# Patient Record
Sex: Male | Born: 1980
Health system: Southern US, Community
[De-identification: ages and names within clinical notes are randomized; demographics above are authoritative.]

## PROBLEM LIST (undated history)

## (undated) DIAGNOSIS — E785 Hyperlipidemia, unspecified: Secondary | ICD-10-CM

## (undated) HISTORY — PX: MOUTH SURGERY: SHX715

---

## 2001-03-26 ENCOUNTER — Emergency Department (HOSPITAL_COMMUNITY): Admission: EM | Admit: 2001-03-26 | Discharge: 2001-03-26 | Payer: Self-pay | Admitting: *Deleted

## 2002-10-05 ENCOUNTER — Emergency Department (HOSPITAL_COMMUNITY): Admission: EM | Admit: 2002-10-05 | Discharge: 2002-10-06 | Payer: Self-pay | Admitting: Emergency Medicine

## 2005-08-16 ENCOUNTER — Ambulatory Visit: Payer: Self-pay | Admitting: Internal Medicine

## 2005-08-25 ENCOUNTER — Ambulatory Visit (HOSPITAL_COMMUNITY): Admission: RE | Admit: 2005-08-25 | Discharge: 2005-08-25 | Payer: Self-pay | Admitting: Internal Medicine

## 2005-08-25 ENCOUNTER — Ambulatory Visit: Payer: Self-pay | Admitting: Internal Medicine

## 2009-09-17 ENCOUNTER — Emergency Department (HOSPITAL_COMMUNITY): Admission: EM | Admit: 2009-09-17 | Discharge: 2009-09-17 | Payer: Self-pay | Admitting: Emergency Medicine

## 2010-07-31 NOTE — Consult Note (Signed)
NAMETERIN, CRAGLE            ACCOUNT NO.:  [id number]   MEDICAL RECORD NO.:  [id number]           PATIENT TYPE:   LOCATION:                                 FACILITY:   PHYSICIAN:  R. Roetta Sessions, M.D. DATE OF BIRTH:  11-08-1980   DATE OF CONSULTATION:  08/16/2005  DATE OF DISCHARGE:                                   CONSULTATION   GASTROINTESTINAL CONSULT   REQUESTING PHYSICIAN:  Dr. Dimas Aguas.   REASON FOR CONSULTATION:  Chronic daily diarrhea.   HISTORY OF PRESENT ILLNESS:  Mr. Kemppainen is a 30 year old Caucasian male  who was referred through the courtesy of Dr. Dimas Aguas and Margaretha Seeds, NP.  He reports a 1-1/2 to 2-year history of chronic daily diarrhea.  He states  he works third shift and generally he will have around 7 loose bowel  movements per night.  He does note some mucous and his stools and denies any  rectal bleeding or melena.  He does have some generalized abdominal pain,  mostly to the right lower quadrant.  He describes the pain as sharp and  shooting.  It is intermittent and it is usually resolved post defecation.  He denies any nausea or vomiting.  He denies any history of constipation.  He denies any fever or chills with this.  He denies any recent foreign  travel.  He denies any antibiotic use.  No new medications except for  diclofenac, which he has taken for heel spurs over the last 6 months twice a  day.  He has started dicyclomine 10 mg t.i.d. about 3 months ago and this  does seem to help somewhat with the diarrhea although he continues to have  frequent stools.  He does have a history of heartburn and indigestion in the  past.  More recently, he has had a few symptoms.  He denies any anorexia,  dysphagia or odynophagia, or early satiety.   PAST MEDICAL HISTORY:  1.  Paranoid schizophrenia.  2.  Heel spurs.  3.  Wisdom teeth extraction.   CURRENT MEDICATIONS:  1.  Geodon 80 mg daily.  2.  Diclofenac 75 mg b.i.d..  3.  Dicyclomine 10 mg  t.i.d.   ALLERGIES:  NO KNOWN DRUG ALLERGIES.   FAMILY HISTORY:  There is no known family history of colorectal carcinoma or  lower GI problems.  Mother age 76 is healthy, he has never met his father,  he has 1 brother diagnosed with leukemia at age 73 and deceased at age 30.   SOCIAL HISTORY:  Mr. Leahy is single.  He lives with his mother and  stepfather.  He works third shift at [employer].  He has a 10-pack year  history of tobacco use.  He consumes a couple of shots of the liquor with 2-  3 beers per night about 4-5 nights a week.  He denies any drug use.   REVIEW OF SYSTEMS:  CONSTITUTION:  Weight is steadily increasing.  He denies  any fatigue.  See HPI.  CARDIOVASCULAR:  Denies any chest pain or  palpitations.  PULMONARY:  Denies any shortness of breath, dyspnea,  cough or  hemoptysis.  GI:  See HPI.   PHYSICAL EXAMINATION:  VITAL SIGNS:  Weight is 240 pounds, height is 72  inches, temp 98.6 degrees, blood pressure 116/70 and pulse 100.  GENERAL:  Mr. Eastridge is a 30 year old obese Caucasian male who is alert  and oriented, pleasant and cooperative in no acute distress.  HEENT:  Sclerae are clear and nonicteric.  Conjunctivae are pink.  Oropharynx is pink and moist without any lesions.  NECK:  Supple without mass or thyromegaly.  CHEST:  Heart regular rate and rhythm with normal S1 and S2 without any  murmurs, clicks, rubs or gallops.  He has right areola piercing.  LUNGS:  Clear to auscultation bilaterally.  ABDOMEN:  Positive bowel sounds x4.  No bruits auscultated.  Soft, nontender  and nondistended without any palpable mass or hepatosplenomegaly.  No  rebound tenderness or guarding.  RECTAL:  No external lesions visualized.  Good sphincter tone.  No  __________ mass is palpated.  A small amount of light brown stool was  obtained from the vault, which is hemoccult negative.  EXTREMITIES:  Without clubbing or edema bilaterally.  SKIN:  Pink, warm and dry.  He does have  an erythematous plaque to his left  inner thigh.   IMPRESSION:  Mr. Rickett is a 30 year old Caucasian male with a 1-1/2 to 2-  year history of chronic daily diarrhea.  He has between 6-7 bowel movements  per night and he works third shift.  He describes his bowel movements as  loose and sometimes watery with mucous.  Denies any blood in his stools.  Given these findings, I suspect he could have inflammatory bowel disease or  he may have severe IBS, as his symptoms are on a daily basis.  He is also on  an NSAID, which can at times produced NSAIDs-induced injury and diarrhea.  He is going to need a further evaluation given the significance of his  symptoms.   PLAN:  1.  CBC, sed rate, MET-7 and a TSH.  2.  Bentyl 10 mg p.o. a.c. and h.s. p.r.n. diarrhea, #60 with 1 refill.  3.  Will schedule colonoscopy with Dr. Jena Gauss in the near future.  I have      discussed the procedure including the risks and benefits to include, but      are not limited to, bleeding, infection, perforation or drug reaction.      He agrees with the plan and consent will be obtained.   We would like to thank Margaretha Seeds, the nurse practitioner, and Dr. Dimas Aguas  for allowing [place] to participate in the care of Mr. Beckford.     Nicholas Lose, N.P.      Jonathon Bellows, M.D.  Electronically Signed   KC/MEDQ  D:  08/16/2005  T:  08/17/2005  Job:  [id number]

## 2010-07-31 NOTE — Op Note (Signed)
NAMECATHERINE, OAK            ACCOUNT NO.:  [id number]   MEDICAL RECORD NO.:  [id number]          PATIENT TYPE:  AMB   LOCATION:  DAY                           FACILITY:  [facility]   PHYSICIAN:  R. Roetta Sessions, M.D. DATE OF BIRTH:  1980-11-28   DATE OF PROCEDURE:  08/25/2005  DATE OF DISCHARGE:                                 OPERATIVE REPORT   PROCEDURE PERFORMED:  Colonoscopy.   INDICATIONS FOR PROCEDURE:  The patient is a  30 year old gentleman with  chronic diarrhea.  He drinks alcohol fairly heavily on a regular basis and  takes diclofenac on regular basis for osteoarthritis symptoms.  Colonoscopy  is now being done.  This approach has been discussed with the patient at  length.  Potential risks, benefits and alternatives have been reviewed,  questions have been answered, he is agreeable.  Please see documentation in  the medical records.   PROCEDURE NOTE:  Oxygen saturations, blood pressure, pulse and respirations  were monitored throughout the entire procedure.   CONSCIOUS SEDATION:  Versed 5 mg IV, Demerol 75 mg IV in divided doses.   FINDINGS:  Digital exam revealed no abnormalities.   ENDOSCOPIC FINDINGS:  Prep was good.   Rectum:  Examination of the rectal mucosa including retroflex view of the  anal verge revealed no abnormalities.  Colon:  Colonic mucosa was surveyed from the rectosigmoid junction to the  left, transverse and right colon to the area of the appendiceal orifice and  ileocecal valve and cecum.  These structures were well seen and photographed  for the record.  From this level, the scope was slowly withdrawn.  All  previously mentioned mucosal surfaces were again seen.  The terminal ileum  was intubated to a good 10 cm.  The colonic mucosa appeared entirely normal  as did the terminal ileal mucosa.  Stool samples were collected for  microbiology studies.  The patient tolerated the procedure well, was reacted  in endoscopy.   IMPRESSION:  Normal  rectum, colon and terminal ileum.   Incidentally, Mr. Robb tells me that starting on Bentyl has  significantly slowed his diarrhea.  I suspect that we are dealing with  irritable bowel syndrome; however, his alcohol consumption on a regular  weekly basis by my estimation is excessive and this may be contributing to  diarrhea.  In addition, diclofenac in combination may also be producing an  element of microscopic colitis or small bowel injury contributing to his  loose bowels.   RECOMMENDATIONS:  1.  I have asked Mr. Starace to cut back significantly on alcohol at least      by 50%.  2.  Stop diclofenac for the time being.  3.  Continue Bentyl 10 mg a.c. and h.s. as needed.  IBS literature given to      Mr. Arnone.  4.  Follow-up on stool studies.  5.  Follow-up appointment with [place] in three months.   ADDENDUM:  From August 17, 2005 a CBC and MET7 completely normal.  A TSH normal  at 1.436.  Sed rate 3.      R. Roetta Sessions, M.D.  Electronically Signed     RMR/MEDQ  D:  08/25/2005  T:  08/25/2005  Job:  [id number]   cc:   Marylee Floras, FNP health dept   Selinda Flavin  Fax: [phone]

## 2014-04-14 ENCOUNTER — Ambulatory Visit (INDEPENDENT_AMBULATORY_CARE_PROVIDER_SITE_OTHER): Payer: BLUE CROSS/BLUE SHIELD | Admitting: Family Medicine

## 2014-04-14 VITALS — BP 134/90 | HR 83 | Temp 98.2°F | Resp 16 | Ht 74.0 in | Wt 244.0 lb

## 2014-04-14 DIAGNOSIS — J019 Acute sinusitis, unspecified: Secondary | ICD-10-CM

## 2014-04-14 DIAGNOSIS — Z720 Tobacco use: Secondary | ICD-10-CM

## 2014-04-14 DIAGNOSIS — K047 Periapical abscess without sinus: Secondary | ICD-10-CM

## 2014-04-14 DIAGNOSIS — H6011 Cellulitis of right external ear: Secondary | ICD-10-CM

## 2014-04-14 DIAGNOSIS — F17200 Nicotine dependence, unspecified, uncomplicated: Secondary | ICD-10-CM

## 2014-04-14 MED ORDER — MUPIROCIN 2 % EX OINT: 1.0000 "application " | TOPICAL_OINTMENT | Freq: Three times a day (TID) | CUTANEOUS | Status: AC

## 2014-04-14 MED ORDER — MAGIC MOUTHWASH W/LIDOCAINE: 10.0000 mL | ORAL | Status: DC | PRN

## 2014-04-14 MED ORDER — IPRATROPIUM-ALBUTEROL 20-100 MCG/ACT IN AERS: 1.0000 | INHALATION_SPRAY | Freq: Four times a day (QID) | RESPIRATORY_TRACT | Status: AC

## 2014-04-14 MED ORDER — ALBUTEROL SULFATE (2.5 MG/3ML) 0.083% IN NEBU
2.5000 mg | INHALATION_SOLUTION | Freq: Once | RESPIRATORY_TRACT | Status: AC
  Administered 2014-04-14: 2.5 mg via RESPIRATORY_TRACT

## 2014-04-14 MED ORDER — AMOXICILLIN-POT CLAVULANATE 875-125 MG PO TABS: 1.0000 | ORAL_TABLET | Freq: Two times a day (BID) | ORAL | Status: DC

## 2014-04-14 NOTE — Progress Notes (Signed)
Subjective:  This chart was scribed for Danny SorensonEva Elen Acero, MD by Modena JanskyAlbert Thayil, ED Scribe. This patient was seen in room 8 and the patient's care was started at 2:00 PM.   Patient ID: Danny Mathews, male    DOB: 30-Oct-1980, 34 y.o.   MRN: [id number] Chief Complaint  Patient presents with  . Mass    roof of mouth  . Cough  . Sore Throat    HPI HPI Comments: Danny Mathews is a 34 y.o. male who presents to the [facility] complaining of an oral sore and cough.   He reports that he has a sore on the roof of his mouth. He states that he noticed a bump about a week after having dental work done. He reports no antibiotic use prior to the dental work. He states that he had an episode of subjective fever and chills 2 days ago. He denies any known discharge or swelling from the sore.   He states that he has some cough and sinus pain. He reports that his cough is productive of yellow phlegm, and the same is coming from his nose. He states he also had some mild SOB and nausea. He reports that he used theraflu and muccinex with relief.     He reports that he smokes about .5 to 1 PPD. He denies any diarrhea, constipation, appetite change, vomiting, or sleep disturbance.   History reviewed. No pertinent past medical history. Allergies no known allergies No current outpatient prescriptions on file prior to visit.   No current facility-administered medications on file prior to visit.    Review of Systems  Constitutional: Positive for fever and chills. Negative for appetite change.  HENT: Positive for sore throat.   Respiratory: Positive for cough and shortness of breath.   Gastrointestinal: Positive for nausea. Negative for diarrhea and constipation.  Psychiatric/Behavioral: Negative for sleep disturbance.      Objective:   Physical Exam  Constitutional: He is oriented to person, place, and time. He appears well-developed and well-nourished. No distress.  HENT:  Head:  Normocephalic and atraumatic.  2 cm nodule with fluctuance, TTP, and no erythema on the right posterior roof of mouth at the posterior part at the hard palate that comes  to a head with a pustule.   Erythema and purulent discharge around piercing. TMs clear.   Nasal mucousal edema. Erythematous orophranyx.   Neck: Neck supple.  Some Submandibular cervical adenopathy.   Cardiovascular: Normal rate, regular rhythm and normal heart sounds.  Exam reveals no gallop and no friction rub.   No murmur heard. Pulmonary/Chest: Effort normal and breath sounds normal. No respiratory distress. He has no wheezes. He has no rales.  Musculoskeletal: Normal range of motion.  Lymphadenopathy:    He has cervical adenopathy.  Neurological: He is alert and oriented to person, place, and time.  Skin: Skin is warm and dry.  Psychiatric: He has a normal mood and affect. His behavior is normal.  Nursing note and vitals reviewed.  BP 134/90 mmHg  Pulse 83  Temp(Src) 98.2 F (36.8 C) (Oral)  Resp 16  Ht 6\' 2"  (1.88 m)  Wt 244 lb (110.678 kg)  BMI 31.31 kg/m2  SpO2 98%    Assessment & Plan:   Dental abscess - Plan: Wound culture - drained in clinic by PA Clark - pt tolerated well w/ immediate reduction in pain - start augmentin and f/u w/ dentist in 1-2 wks.  Acute sinusitis, recurrence not specified,  unspecified location - Plan: albuterol (PROVENTIL) (2.5 MG/3ML) 0.083% nebulizer solution 2.5 mg  Tobacco use disorder - pt w/ symptomatic improvement after duoneb so try combivent prn while ill.  Cellulitis of earlobe, right>left around ear gage piercing - all foreign bodies currently removed - Augmentin should provide adequate coverage but can also add on top bactroban after wet warm compresses - ok to cont cleaning w/ peroxide and soap/water, RTC if worsening.  Meds ordered this encounter  Medications  . amoxicillin-clavulanate (AUGMENTIN) 875-125 MG per tablet    Sig: Take 1 tablet by mouth 2 (two)  times daily.    Dispense:  28 tablet    Refill:  0  . Alum & Mag Hydroxide-Simeth (MAGIC MOUTHWASH W/LIDOCAINE) SOLN    Sig: Take 10 mLs by mouth every 2 (two) hours as needed for mouth pain.    Dispense:  360 mL    Refill:  0    Mix 1:1 ratio of maalox and viscous lidocaine.  . mupirocin ointment (BACTROBAN) 2 %    Sig: Apply 1 application topically 3 (three) times daily.    Dispense:  30 g    Refill:  0  . albuterol (PROVENTIL) (2.5 MG/3ML) 0.083% nebulizer solution 2.5 mg    Sig:   . Ipratropium-Albuterol (COMBIVENT) 20-100 MCG/ACT AERS respimat    Sig: Inhale 1 puff into the lungs every 6 (six) hours.    Dispense:  4 g    Refill:  0    I personally performed the services described in this documentation, which was scribed in my presence. The recorded information has been reviewed and considered, and addended by me as needed.  Danny Sorenson, MD MPH

## 2014-04-14 NOTE — Patient Instructions (Signed)

## 2014-04-14 NOTE — Progress Notes (Signed)
Procedure:  Verbal consent obtained.  Patient declined local anesthesia.  An 18 gauge needle was used to incise the abscess.  Purulent material was expressed and suctioned.  The patient tolerated the procedure without complication.  Deliah BostonMichael Coralee Edberg, MS, PA-C   2:42 PM, 04/14/2014

## 2014-04-17 LAB — WOUND CULTURE
Gram Stain: NONE SEEN
Gram Stain: NONE SEEN
Gram Stain: NONE SEEN
Organism ID, Bacteria: NO GROWTH

## 2014-04-19 ENCOUNTER — Telehealth: Payer: Self-pay | Admitting: Family Medicine

## 2014-04-19 NOTE — Telephone Encounter (Signed)
Patient called back about lab results. Delivered the following message: "No bacteria grew from the culture that was collected from the abscess on the roof of his mouth when it was drained - however, still needs to complete full antibiotic course and f/u w/ dentist asap." Patient understood. Please call [phone] if further discussion is necessary.  Thanks, Costco WholesaleJasmine

## 2015-10-10 ENCOUNTER — Emergency Department (HOSPITAL_COMMUNITY): Payer: BLUE CROSS/BLUE SHIELD

## 2015-10-10 ENCOUNTER — Encounter (HOSPITAL_COMMUNITY): Payer: Self-pay

## 2015-10-10 ENCOUNTER — Ambulatory Visit (INDEPENDENT_AMBULATORY_CARE_PROVIDER_SITE_OTHER): Payer: BLUE CROSS/BLUE SHIELD | Admitting: Physician Assistant

## 2015-10-10 ENCOUNTER — Emergency Department (HOSPITAL_COMMUNITY)
Admission: EM | Admit: 2015-10-10 | Discharge: 2015-10-10 | Disposition: A | Discharge status: Home / Self Care | Payer: BLUE CROSS/BLUE SHIELD | Attending: Emergency Medicine | Admitting: Emergency Medicine

## 2015-10-10 VITALS — BP 122/70 | HR 91 | Temp 98.3°F | Resp 18 | Ht 73.0 in | Wt 248.0 lb

## 2015-10-10 DIAGNOSIS — R9431 Abnormal electrocardiogram [ECG] [EKG]: Secondary | ICD-10-CM

## 2015-10-10 DIAGNOSIS — F17210 Nicotine dependence, cigarettes, uncomplicated: Secondary | ICD-10-CM | POA: Diagnosis not present

## 2015-10-10 DIAGNOSIS — R079 Chest pain, unspecified: Secondary | ICD-10-CM

## 2015-10-10 DIAGNOSIS — R072 Precordial pain: Secondary | ICD-10-CM | POA: Diagnosis present

## 2015-10-10 DIAGNOSIS — R0789 Other chest pain: Secondary | ICD-10-CM | POA: Insufficient documentation

## 2015-10-10 HISTORY — DX: Hyperlipidemia, unspecified: E78.5

## 2015-10-10 LAB — CBC
HCT: 39.2 % (ref 39.0–52.0)
Hemoglobin: 13.4 g/dL (ref 13.0–17.0)
MCH: 30 pg (ref 26.0–34.0)
MCHC: 34.2 g/dL (ref 30.0–36.0)
MCV: 87.9 fL (ref 78.0–100.0)
PLATELETS: 247 10*3/uL (ref 150–400)
RBC: 4.46 MIL/uL (ref 4.22–5.81)
RDW: 12.8 % (ref 11.5–15.5)
WBC: 9.2 10*3/uL (ref 4.0–10.5)

## 2015-10-10 LAB — I-STAT TROPONIN, ED: TROPONIN I, POC: 0 ng/mL (ref 0.00–0.08)

## 2015-10-10 LAB — BASIC METABOLIC PANEL
Anion gap: 7 (ref 5–15)
BUN: 12 mg/dL (ref 6–20)
CALCIUM: 9.6 mg/dL (ref 8.9–10.3)
CHLORIDE: 105 mmol/L (ref 101–111)
CO2: 26 mmol/L (ref 22–32)
CREATININE: 0.77 mg/dL (ref 0.61–1.24)
GFR calc non Af Amer: >60 mL/min (ref >60)
GLUCOSE: 93 mg/dL (ref 65–99)
Potassium: 4 mmol/L (ref 3.5–5.1)
Sodium: 138 mmol/L (ref 135–145)

## 2015-10-10 MED ORDER — ASPIRIN 81 MG PO CHEW
324.0000 mg | CHEWABLE_TABLET | Freq: Once | ORAL | Status: AC
  Administered 2015-10-10: 324 mg via ORAL

## 2015-10-10 NOTE — ED Triage Notes (Signed)
Pt here for CP X3 days. Radiation into the back and as well as the groin. 324 of ASA at PCP, IV in place. Chest pain free on arrival.

## 2015-10-10 NOTE — Patient Instructions (Signed)
     IF you received an x-ray today, you will receive an invoice from [facility]. Please contact [facility] at [phone] with questions or concerns regarding your invoice.   IF you received labwork today, you will receive an invoice from [facility]. Please contact [facility] at [phone] with questions or concerns regarding your invoice.   Our billing staff will not be able to assist you with questions regarding bills from these companies.  You will be contacted with the lab results as soon as they are available. The fastest way to get your results is to activate your My Chart account. Instructions are located on the last page of this paperwork. If you have not heard from us regarding the results in 2 weeks, please contact this office.      

## 2015-10-10 NOTE — Progress Notes (Signed)
Report given to charge nurse Melissa 22G IV inserted via left hand without diff Clammy and diaphoresis noted, denies sob or chest pain at this time

## 2015-10-10 NOTE — Progress Notes (Signed)
   10/10/2015 8:49 AM   DOB: 11/19/1980 / MRN: [id number]  SUBJECTIVE:  Danny Mathews is a 35 y.o. male presenting for chest pain that started three days ago.  The pain radiates to he back.  He associates SOB and new DOE.  He has a long history of smoking.  He uncle died of a heart attack at age 79.  He denies a personal history of HTN however does report a history of dyslipidemia.  He has no EKG for comparison purposes.   He has No Known Allergies.   He  has no past medical history on file.    He  reports that he has been smoking.  He does not have any smokeless tobacco history on file. He  has no sexual activity history on file. The patient  has a past surgical history that includes Mouth surgery.  His family history includes Diabetes in his mother.  Review of Systems  Constitutional: Negative for fever.  Respiratory: Positive for shortness of breath. Negative for cough and wheezing.   Cardiovascular: Positive for chest pain. Negative for orthopnea and leg swelling.  Skin: Negative for rash.  Neurological: Negative for dizziness.    Problem list and medications reviewed and updated by myself where necessary, and exist elsewhere in the encounter.   OBJECTIVE:  BP 122/70 (BP Location: Right Arm, Patient Position: Sitting, Cuff Size: Large)   Pulse 91   Temp 98.3 F (36.8 C)   Resp 18   Ht 6\' 1"  (1.854 m)   Wt 248 lb (112.5 kg)   SpO2 98%   BMI 32.72 kg/m   Physical Exam  Constitutional: He is oriented to person, place, and time. He appears well-developed and well-nourished. No distress.  Cardiovascular: Normal rate, regular rhythm, normal heart sounds and intact distal pulses.  Exam reveals no gallop and no friction rub.   No murmur heard. Pulmonary/Chest: Effort normal and breath sounds normal. No respiratory distress. He has no wheezes. He has no rales. He exhibits no tenderness.  Abdominal: Soft. Bowel sounds are normal.  Neurological: He is alert and oriented to  person, place, and time.  Skin: Skin is warm and dry. He is not diaphoretic.  Psychiatric: He has a normal mood and affect.    No results found for this or any previous visit (from the past 72 hour(s)).  No results found.  ASSESSMENT AND PLAN  Danny Mathews was seen today for chest pain and shortness of breath.  Diagnoses and all orders for this visit:  Chest pain, unspecified chest pain type: Given his HPI and t-wave inversion in lead three he needs a rule out. We call 911 and he will go to [facility] for a rule out.   We have given him 324 or asa.    -     EKG 12-Lead  Anterior T wave inversion:     The patient was advised to call or return to clinic if he does not see an improvement in symptoms, or to seek the care of the closest emergency department if he worsens with the above plan.   Deliah Boston, MHS, PA-C [facility] 10/10/2015 8:49 AM

## 2015-10-10 NOTE — ED Provider Notes (Addendum)
[facility]-EMERGENCY DEPT Provider Note   CSN: [id number] Arrival date & time: 10/10/15  0254  First Provider Contact:  First MD Initiated Contact with Patient 10/10/15 [phone]        History   Chief Complaint Chief Complaint  Patient presents with  . Chest Pain    HPI Danny Mathews is a 35 y.o. male.  The history is provided by the patient.  Chest Pain   This is a new problem. Episode onset: 3 days. The problem occurs constantly. Progression since onset: waxing and waning. The pain is associated with movement, breathing and coughing. The pain is present in the lateral region and substernal region. The pain is at a severity of 5/10. The pain is moderate. The quality of the pain is described as sharp, stabbing and pleuritic. The pain does not radiate. Duration of episode(s) is 3 days. The symptoms are aggravated by deep breathing and certain positions. Associated symptoms include cough and shortness of breath. Pertinent negatives include no abdominal pain, no fever, no headaches, no hemoptysis, no lower extremity edema, no nausea, no sputum production, no vomiting and no weakness. Associated symptoms comments: Nasal congestion. He has tried nothing for the symptoms. The treatment provided no relief. Risk factors include male gender and smoking/tobacco exposure (family hx of uncle with heart attack in his 41's.  no one else in the family with heart disease).  His past medical history is significant for hyperlipidemia.  Pertinent negatives for past medical history include no CAD, no diabetes and no hypertension.  His family medical history is significant for CAD and heart disease.    Past Medical History:  Diagnosis Date  . Hyperlipidemia     There are no active problems to display for this patient.   Past Surgical History:  Procedure Laterality Date  . MOUTH SURGERY         Home Medications    Prior to Admission medications   Medication Sig Start Date End Date Taking?  Authorizing Provider  Alum & Mag Hydroxide-Simeth (MAGIC MOUTHWASH W/LIDOCAINE) SOLN Take 10 mLs by mouth every 2 (two) hours as needed for mouth pain. Patient not taking: Reported on 10/10/2015 04/14/14   Sherren Mocha, MD  amoxicillin-clavulanate (AUGMENTIN) 875-125 MG per tablet Take 1 tablet by mouth 2 (two) times daily. Patient not taking: Reported on 10/10/2015 04/14/14   Sherren Mocha, MD  Ipratropium-Albuterol (COMBIVENT) 20-100 MCG/ACT AERS respimat Inhale 1 puff into the lungs every 6 (six) hours. Patient not taking: Reported on 10/10/2015 04/14/14   Sherren Mocha, MD  mupirocin ointment (BACTROBAN) 2 % Apply 1 application topically 3 (three) times daily. Patient not taking: Reported on 10/10/2015 04/14/14   Sherren Mocha, MD    Family History Family History  Problem Relation Age of Onset  . Diabetes Mother     Social History Social History  Substance Use Topics  . Smoking status: Current Every Day Smoker    Packs/day: 1.00    Types: Cigarettes  . Smokeless tobacco: Current User    Types: Chew  . Alcohol use 0.0 oz/week     Comment: 3-4 beers 3 days per week     Allergies   Review of patient's allergies indicates no known allergies.   Review of Systems Review of Systems  Constitutional: Negative for fever.  Respiratory: Positive for cough and shortness of breath. Negative for hemoptysis and sputum production.   Cardiovascular: Positive for chest pain.  Gastrointestinal: Negative for abdominal pain, nausea and vomiting.  Neurological:  Negative for weakness and headaches.  All other systems reviewed and are negative.    Physical Exam Updated Vital Signs BP 120/81 (BP Location: Right Arm)   Pulse 81   Temp 98.1 F (36.7 C) (Oral)   Resp 14   Ht 6\' 1"  (1.854 m)   Wt 248 lb (112.5 kg)   SpO2 98%   BMI 32.72 kg/m   Physical Exam  Constitutional: He is oriented to person, place, and time. He appears well-developed and well-nourished. No distress.  HENT:  Head:  Normocephalic and atraumatic.  Mouth/Throat: Oropharynx is clear and moist.  Eyes: Conjunctivae and EOM are normal. Pupils are equal, round, and reactive to light.  Neck: Normal range of motion. Neck supple.  Cardiovascular: Normal rate, regular rhythm and intact distal pulses.   No murmur heard. Pulmonary/Chest: Effort normal and breath sounds normal. No respiratory distress. He has no wheezes. He has no rales. He exhibits tenderness. He exhibits no crepitus.    Abdominal: Soft. He exhibits no distension. There is no tenderness. There is no rebound and no guarding.  Musculoskeletal: Normal range of motion. He exhibits no edema or tenderness.  Neurological: He is alert and oriented to person, place, and time.  Skin: Skin is warm and dry. No rash noted. No erythema.  Psychiatric: He has a normal mood and affect. His behavior is normal.  Nursing note and vitals reviewed.    ED Treatments / Results  Labs (all labs ordered are listed, but only abnormal results are displayed) Labs Reviewed  BASIC METABOLIC PANEL  CBC  I-STAT TROPOININ, ED    EKG  EKG Interpretation  Date/Time:  Friday October 10 2015 09:36:33 EDT Ventricular Rate:  73 PR Interval:    QRS Duration: 89 QT Interval:  356 QTC Calculation: 393 R Axis:   54 Text Interpretation:  Sinus rhythm Consider left atrial enlargement No previous tracing Confirmed by Anitra Lauth  MD, Alphonzo Lemmings ([id number]) on 10/10/2015 9:44:25 AM       Radiology Dg Chest 2 View  Result Date: 10/10/2015 CLINICAL DATA:  Intermittent left chest pain for few weeks, worsened today. EXAM: CHEST  2 VIEW COMPARISON:  None. FINDINGS: The lungs are clear. Heart size is normal. No pneumothorax or pleural effusion. No bony abnormality. IMPRESSION: Negative chest. Electronically Signed   By: Drusilla Kanner M.D.   On: 10/10/2015 10:16   Procedures Procedures (including critical care time)  Medications Ordered in ED Medications - No data to display   Initial  Impression / Assessment and Plan / ED Course  I have reviewed the triage vital signs and the nursing notes.  Pertinent labs & imaging results that were available during my care of the patient were reviewed by me and considered in my medical decision making (see chart for details).  Clinical Course   Pt with atypical story for CP that is more pleuritic in nature and seems to have started around the time of a sinus congestion.  Heart 2 for HLD/smoking and family hx.  PERC neg.  EKG wnl.  Low suspicion for ACS, PE, dissection. Possible that this could be GERD related as he recently stopped taking antacids a few days ago but most likely pleurisy but will r/o PTX CXR, CBC, BMP, trop pending.  11:25 AM All labs wnl.  Imaging neg.  Final Clinical Impressions(s) / ED Diagnoses   Final diagnoses:  Chest wall pain    New Prescriptions Current Discharge Medication List       Gwyneth Sprout, MD 10/10/15  1125    Gwyneth Sprout, MD 10/10/15 1127

## 2017-12-28 DIAGNOSIS — J029 Acute pharyngitis, unspecified: Secondary | ICD-10-CM | POA: Diagnosis not present

## 2017-12-28 DIAGNOSIS — R05 Cough: Secondary | ICD-10-CM | POA: Diagnosis not present

## 2017-12-30 ENCOUNTER — Other Ambulatory Visit: Payer: Self-pay

## 2017-12-30 ENCOUNTER — Emergency Department (HOSPITAL_COMMUNITY): Payer: BLUE CROSS/BLUE SHIELD

## 2017-12-30 ENCOUNTER — Emergency Department (HOSPITAL_COMMUNITY)
Admission: EM | Admit: 2017-12-30 | Discharge: 2017-12-30 | Disposition: A | Discharge status: Home / Self Care | Payer: BLUE CROSS/BLUE SHIELD | Attending: Emergency Medicine | Admitting: Emergency Medicine

## 2017-12-30 ENCOUNTER — Encounter (HOSPITAL_COMMUNITY): Payer: Self-pay | Admitting: Emergency Medicine

## 2017-12-30 DIAGNOSIS — Z79899 Other long term (current) drug therapy: Secondary | ICD-10-CM | POA: Diagnosis not present

## 2017-12-30 DIAGNOSIS — F17210 Nicotine dependence, cigarettes, uncomplicated: Secondary | ICD-10-CM | POA: Diagnosis not present

## 2017-12-30 DIAGNOSIS — R509 Fever, unspecified: Secondary | ICD-10-CM

## 2017-12-30 DIAGNOSIS — J069 Acute upper respiratory infection, unspecified: Secondary | ICD-10-CM | POA: Diagnosis not present

## 2017-12-30 DIAGNOSIS — R05 Cough: Secondary | ICD-10-CM | POA: Diagnosis not present

## 2017-12-30 DIAGNOSIS — E785 Hyperlipidemia, unspecified: Secondary | ICD-10-CM | POA: Insufficient documentation

## 2017-12-30 LAB — CBC WITH DIFFERENTIAL/PLATELET
Abs Immature Granulocytes: 0.01 10*3/uL (ref 0.00–0.07)
BASOS PCT: 1 %
Basophils Absolute: 0 10*3/uL (ref 0.0–0.1)
EOS ABS: 0 10*3/uL (ref 0.0–0.5)
Eosinophils Relative: 1 %
HEMATOCRIT: 46.3 % (ref 39.0–52.0)
HEMOGLOBIN: 15.4 g/dL (ref 13.0–17.0)
Immature Granulocytes: 0 %
Lymphocytes Relative: 19 %
Lymphs Abs: 1.1 10*3/uL (ref 0.7–4.0)
MCH: 30.4 pg (ref 26.0–34.0)
MCHC: 33.3 g/dL (ref 30.0–36.0)
MCV: 91.5 fL (ref 80.0–100.0)
MONO ABS: 0.7 10*3/uL (ref 0.1–1.0)
MONOS PCT: 11 %
NEUTROS PCT: 68 %
Neutro Abs: 4.1 10*3/uL (ref 1.7–7.7)
Platelets: 181 10*3/uL (ref 150–400)
RBC: 5.06 MIL/uL (ref 4.22–5.81)
RDW: 12.8 % (ref 11.5–15.5)
WBC: 5.9 10*3/uL (ref 4.0–10.5)
nRBC: 0 % (ref 0.0–0.2)

## 2017-12-30 LAB — COMPREHENSIVE METABOLIC PANEL
ALBUMIN: 4.4 g/dL (ref 3.5–5.0)
ALK PHOS: 66 U/L (ref 38–126)
ALT: 24 U/L (ref 0–44)
ANION GAP: 9 (ref 5–15)
AST: 23 U/L (ref 15–41)
BILIRUBIN TOTAL: 0.9 mg/dL (ref 0.3–1.2)
BUN: 14 mg/dL (ref 6–20)
CO2: 23 mmol/L (ref 22–32)
Calcium: 9.2 mg/dL (ref 8.9–10.3)
Chloride: 99 mmol/L (ref 98–111)
Creatinine, Ser: 0.95 mg/dL (ref 0.61–1.24)
GFR calc Af Amer: >60 mL/min (ref >60)
GFR calc non Af Amer: >60 mL/min (ref >60)
GLUCOSE: 98 mg/dL (ref 70–99)
Potassium: 4.1 mmol/L (ref 3.5–5.1)
Sodium: 131 mmol/L — ABNORMAL LOW (ref 135–145)
TOTAL PROTEIN: 8.4 g/dL — AB (ref 6.5–8.1)

## 2017-12-30 LAB — URINALYSIS, ROUTINE W REFLEX MICROSCOPIC
BACTERIA UA: NONE SEEN
BILIRUBIN URINE: NEGATIVE
GLUCOSE, UA: NEGATIVE mg/dL
Ketones, ur: 5 mg/dL — AB
LEUKOCYTES UA: NEGATIVE
NITRITE: NEGATIVE
PROTEIN: 30 mg/dL — AB
Specific Gravity, Urine: 1.02 (ref 1.005–1.030)
pH: 5 (ref 5.0–8.0)

## 2017-12-30 LAB — LACTIC ACID, PLASMA
Lactic Acid, Venous: 1.2 mmol/L (ref 0.5–1.9)
Lactic Acid, Venous: 1 mmol/L (ref 0.5–1.9)

## 2017-12-30 LAB — INFLUENZA PANEL BY PCR (TYPE A & B)
INFLBPCR: NEGATIVE
Influenza A By PCR: NEGATIVE

## 2017-12-30 LAB — GROUP A STREP BY PCR: Group A Strep by PCR: NOT DETECTED

## 2017-12-30 MED ORDER — SODIUM CHLORIDE 0.9 % IV BOLUS
1000.0000 mL | Freq: Once | INTRAVENOUS | Status: AC
  Administered 2017-12-30: 1000 mL via INTRAVENOUS

## 2017-12-30 MED ORDER — AZITHROMYCIN 250 MG PO TABS: 250.0000 mg | ORAL_TABLET | Freq: Every day | ORAL | 0 refills | Status: AC

## 2017-12-30 MED ORDER — AZITHROMYCIN 250 MG PO TABS
500.0000 mg | ORAL_TABLET | Freq: Once | ORAL | Status: AC
  Administered 2017-12-30: 500 mg via ORAL
  Filled 2017-12-30: qty 2

## 2017-12-30 NOTE — ED Triage Notes (Signed)
Fever, sore throat and cough since Tuesday.  Seen at urgent care, tested for strep and flu that was negative.

## 2017-12-30 NOTE — Discharge Instructions (Addendum)
You were seen in the emergency department for continued fever along with some upper respiratory infection symptoms.  You had blood work strep throat test flu test and a chest x-ray that did not show an obvious cause of your symptoms.  We are prescribing you a new antibiotic to take.  Please keep well-hydrated and use Tylenol and ibuprofen for fever control.  Follow-up with your doctor or return if any worsening symptoms.

## 2017-12-30 NOTE — ED Notes (Signed)
ED Provider at bedside. 

## 2017-12-30 NOTE — ED Provider Notes (Signed)
[facility] EMERGENCY DEPARTMENT Provider Note   CSN: [id number] Arrival date & time: 12/30/17  1643     History   Chief Complaint Chief Complaint  Patient presents with  . Fever    HPI Danny Mathews is a 37 y.o. male.  He is complaining of feeling sick since Tuesday.  He has had fevers and chills temperature up to 104.  He has had a minimally productive cough.  Sore throat.  Body aches.  He said he went to urgent care on Wednesday and they tested him for flu and strep throat and put him on penicillin.  He is continued on the antibiotics but it has not changed anything.  He is had decreased appetite and some loose stools.  No urinary symptoms.  He had a sick contact with somebody with strep positive.  No recent travel no rashes no swollen joints no tick bites.  The history is provided by the patient.  Fever   This is a new problem. Episode onset: 4 days. The problem occurs daily. The problem has not changed since onset.The maximum temperature noted was 103 to 104 F. Associated symptoms include diarrhea, sore throat, muscle aches and cough. Pertinent negatives include no chest pain and no vomiting. He has tried acetaminophen and ibuprofen for the symptoms. The treatment provided moderate relief.    Past Medical History:  Diagnosis Date  . Hyperlipidemia     There are no active problems to display for this patient.   Past Surgical History:  Procedure Laterality Date  . MOUTH SURGERY          Home Medications    Prior to Admission medications   Medication Sig Start Date End Date Taking? Authorizing Provider  Ipratropium-Albuterol (COMBIVENT) 20-100 MCG/ACT AERS respimat Inhale 1 puff into the lungs every 6 (six) hours. Patient not taking: Reported on 10/10/2015 04/14/14   Sherren Mocha, MD  loperamide (IMODIUM) 2 MG capsule Take 2 mg by mouth as needed for diarrhea or loose stools.    [provider]  mupirocin ointment (BACTROBAN) 2 % Apply 1 application  topically 3 (three) times daily. 04/14/14   Sherren Mocha, MD  ranitidine (ZANTAC) 150 MG tablet Take 150 mg by mouth 2 (two) times daily.    [provider]    Family History Family History  Problem Relation Age of Onset  . Diabetes Mother     Social History Social History   Tobacco Use  . Smoking status: Current Every Day Smoker    Packs/day: 1.00    Types: Cigarettes  . Smokeless tobacco: Current User    Types: Chew  Substance Use Topics  . Alcohol use: Yes    Alcohol/week: 0.0 standard drinks    Comment: 3-4 beers 3 days per week  . Drug use: Not on file     Allergies   Patient has no known allergies.   Review of Systems Review of Systems  Constitutional: Positive for fever.  HENT: Positive for sore throat. Negative for rhinorrhea.   Eyes: Negative for visual disturbance.  Respiratory: Positive for cough. Negative for shortness of breath.   Cardiovascular: Negative for chest pain.  Gastrointestinal: Positive for diarrhea. Negative for abdominal pain and vomiting.  Genitourinary: Negative for dysuria.  Musculoskeletal: Positive for myalgias.  Skin: Negative for rash.  Neurological: Negative for syncope.     Physical Exam Updated Vital Signs BP 116/80 (BP Location: Right Arm)   Pulse 77   Temp 98.7 F (37.1 C) (Oral)  Resp 16   Ht 6\' 4"  (1.93 m)   Wt 104.3 kg   SpO2 96%   BMI 28.00 kg/m   Physical Exam  Constitutional: He appears well-developed and well-nourished.  HENT:  Head: Normocephalic and atraumatic.  Right Ear: Tympanic membrane and ear canal normal.  Left Ear: Tympanic membrane and ear canal normal.  Mouth/Throat: Mucous membranes are normal. Posterior oropharyngeal erythema present. No oropharyngeal exudate, posterior oropharyngeal edema or tonsillar abscesses.  Eyes: Conjunctivae are normal.  Neck: Neck supple.  Cardiovascular: Normal rate and regular rhythm.  No murmur heard. Pulmonary/Chest: Effort normal and breath sounds  normal. No respiratory distress.  Abdominal: Soft. There is no tenderness.  Musculoskeletal: He exhibits no edema.  Neurological: He is alert.  Skin: Skin is warm and dry.  Psychiatric: He has a normal mood and affect.  Nursing note and vitals reviewed.    ED Treatments / Results  Labs (all labs ordered are listed, but only abnormal results are displayed) Labs Reviewed  COMPREHENSIVE METABOLIC PANEL - Abnormal; Notable for the following components:      Result Value   Sodium 131 (*)    Total Protein 8.4 (*)    All other components within normal limits  URINALYSIS, ROUTINE W REFLEX MICROSCOPIC - Abnormal; Notable for the following components:   APPearance HAZY (*)    Hgb urine dipstick SMALL (*)    Ketones, ur 5 (*)    Protein, ur 30 (*)    All other components within normal limits  GROUP A STREP BY PCR  CBC WITH DIFFERENTIAL/PLATELET  LACTIC ACID, PLASMA  LACTIC ACID, PLASMA  INFLUENZA PANEL BY PCR (TYPE A & B)    EKG None  Radiology Dg Chest 2 View  Result Date: 12/30/2017 CLINICAL DATA:  Productive cough with fever EXAM: CHEST - 2 VIEW COMPARISON:  10/10/2015 FINDINGS: Normal heart size and mediastinal contours. No acute infiltrate or edema. No effusion or pneumothorax. No acute osseous findings. IMPRESSION: Negative chest. Electronically Signed   By: Marnee Spring M.D.   On: 12/30/2017 19:11    Procedures Procedures (including critical care time)  Medications Ordered in ED Medications  sodium chloride 0.9 % bolus 1,000 mL (0 mLs Intravenous Stopped 12/30/17 1916)  azithromycin (ZITHROMAX) tablet 500 mg (500 mg Oral Given 12/30/17 1951)     Initial Impression / Assessment and Plan / ED Course  I have reviewed the triage vital signs and the nursing notes.  Pertinent labs & imaging results that were available during my care of the patient were reviewed by me and considered in my medical decision making (see chart for details).  Clinical Course as of Jan 01 1251  Fri Dec 30, 2017  1944 Viewed results with patient.  The only thing that we have is that he is got some red blood cells in his urine but he has got no urinary symptoms and no flank pain.  His symptoms seem mostly upper respiratory so I may switch him to some Zithromax just to see if we need more atypical coverage.   [MB]    Clinical Course User Index [MB] Terrilee Files, MD     Final Clinical Impressions(s) / ED Diagnoses   Final diagnoses:  Fever, unspecified fever cause  Upper respiratory tract infection, unspecified type    ED Discharge Orders         Ordered    azithromycin (ZITHROMAX) 250 MG tablet  Daily     12/30/17 1946  Terrilee Files, MD 12/31/17 [phone]

## 2019-06-28 DIAGNOSIS — E785 Hyperlipidemia, unspecified: Secondary | ICD-10-CM | POA: Diagnosis not present

## 2019-06-28 DIAGNOSIS — Z1331 Encounter for screening for depression: Secondary | ICD-10-CM | POA: Diagnosis not present

## 2019-06-28 DIAGNOSIS — K219 Gastro-esophageal reflux disease without esophagitis: Secondary | ICD-10-CM | POA: Diagnosis not present

## 2019-06-28 DIAGNOSIS — F17200 Nicotine dependence, unspecified, uncomplicated: Secondary | ICD-10-CM | POA: Diagnosis not present

## 2019-06-28 DIAGNOSIS — F1010 Alcohol abuse, uncomplicated: Secondary | ICD-10-CM | POA: Diagnosis not present

## 2019-11-14 DIAGNOSIS — K921 Melena: Secondary | ICD-10-CM | POA: Diagnosis not present

## 2019-11-14 DIAGNOSIS — R1084 Generalized abdominal pain: Secondary | ICD-10-CM | POA: Diagnosis not present

## 2020-09-22 IMAGING — DX DG CHEST 2V
2 series · 2 of 2 positions shown · non-contrast
Comparison: 10/10/2015

CLINICAL DATA: Productive cough with fever

EXAM:
CHEST - 2 VIEW

[chest pa]
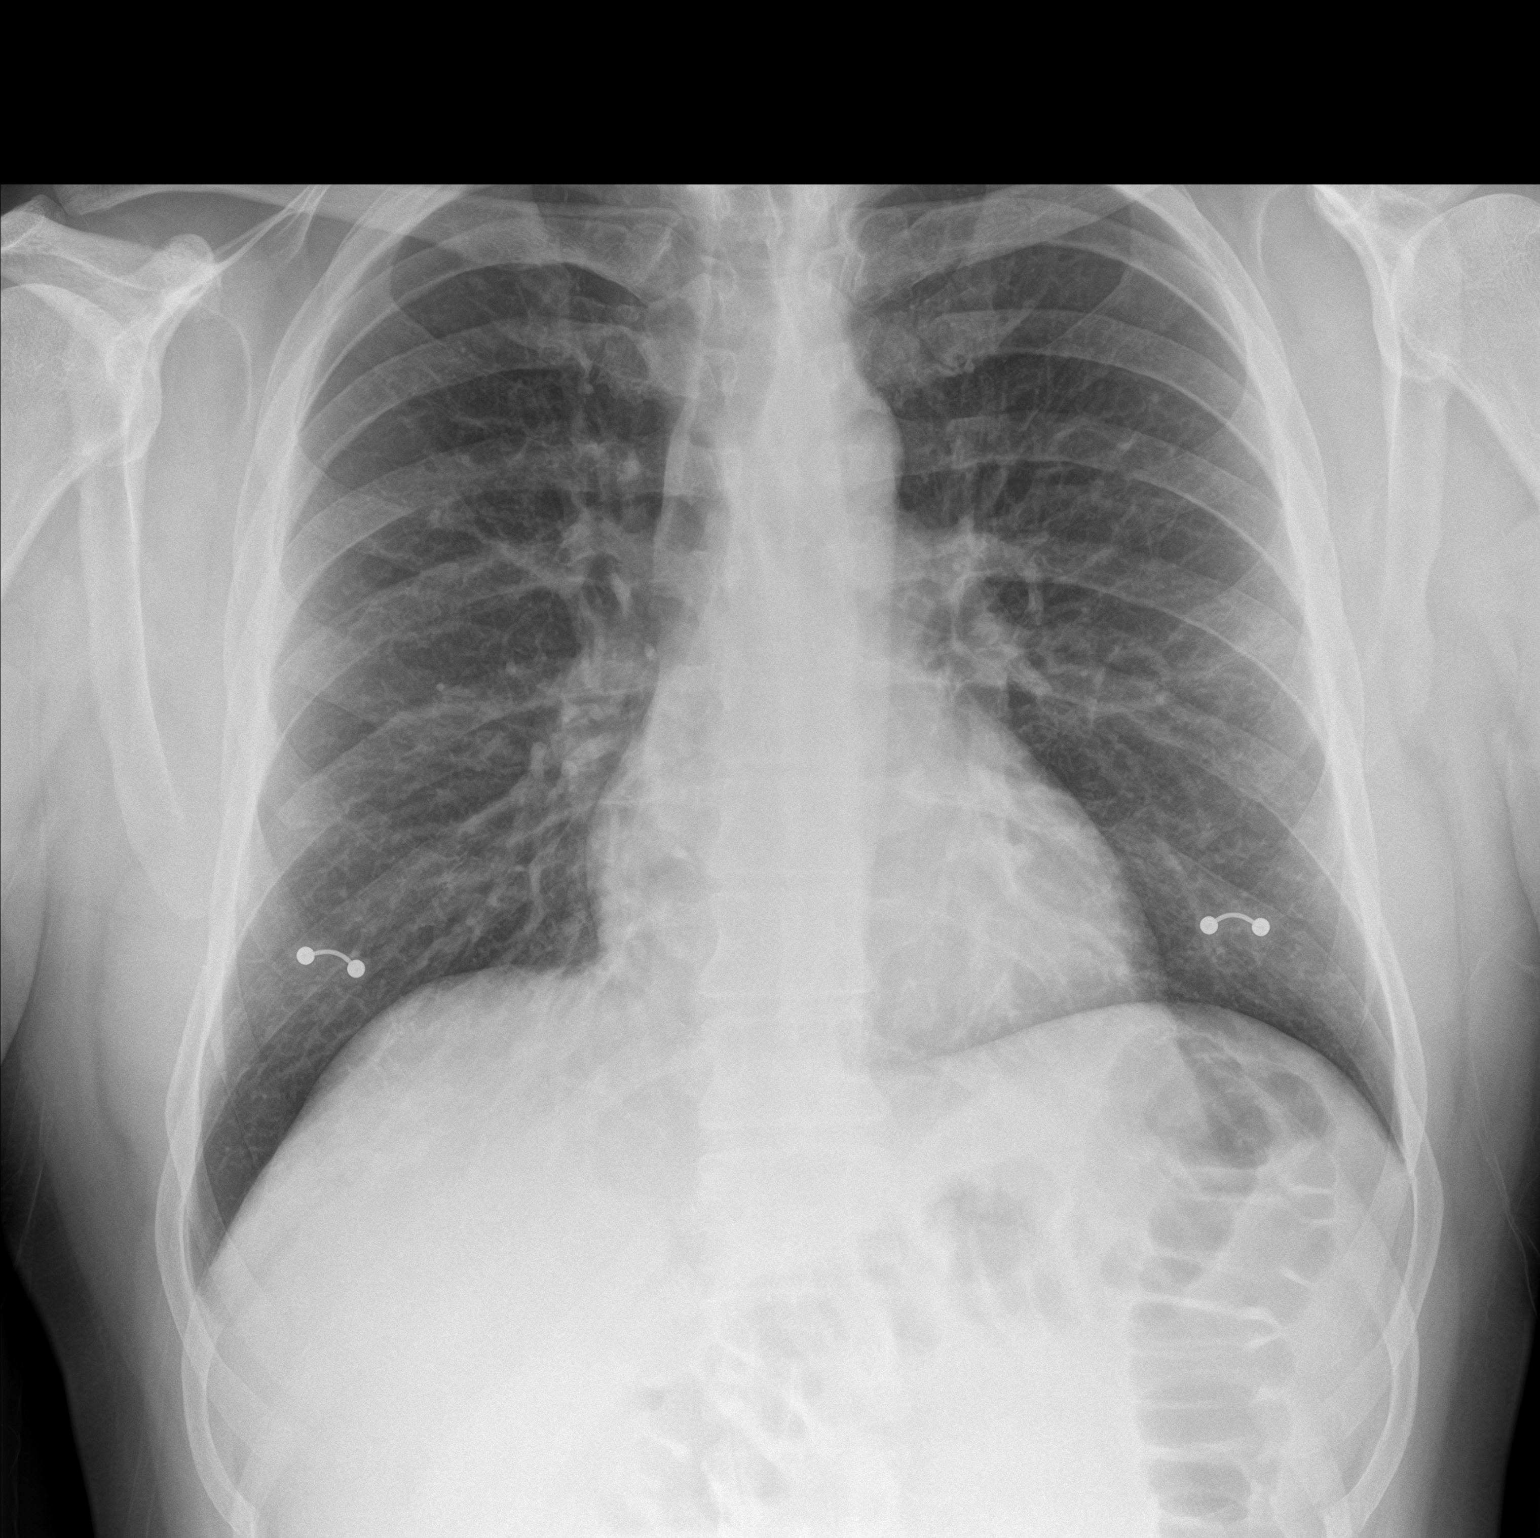

[chest lat]
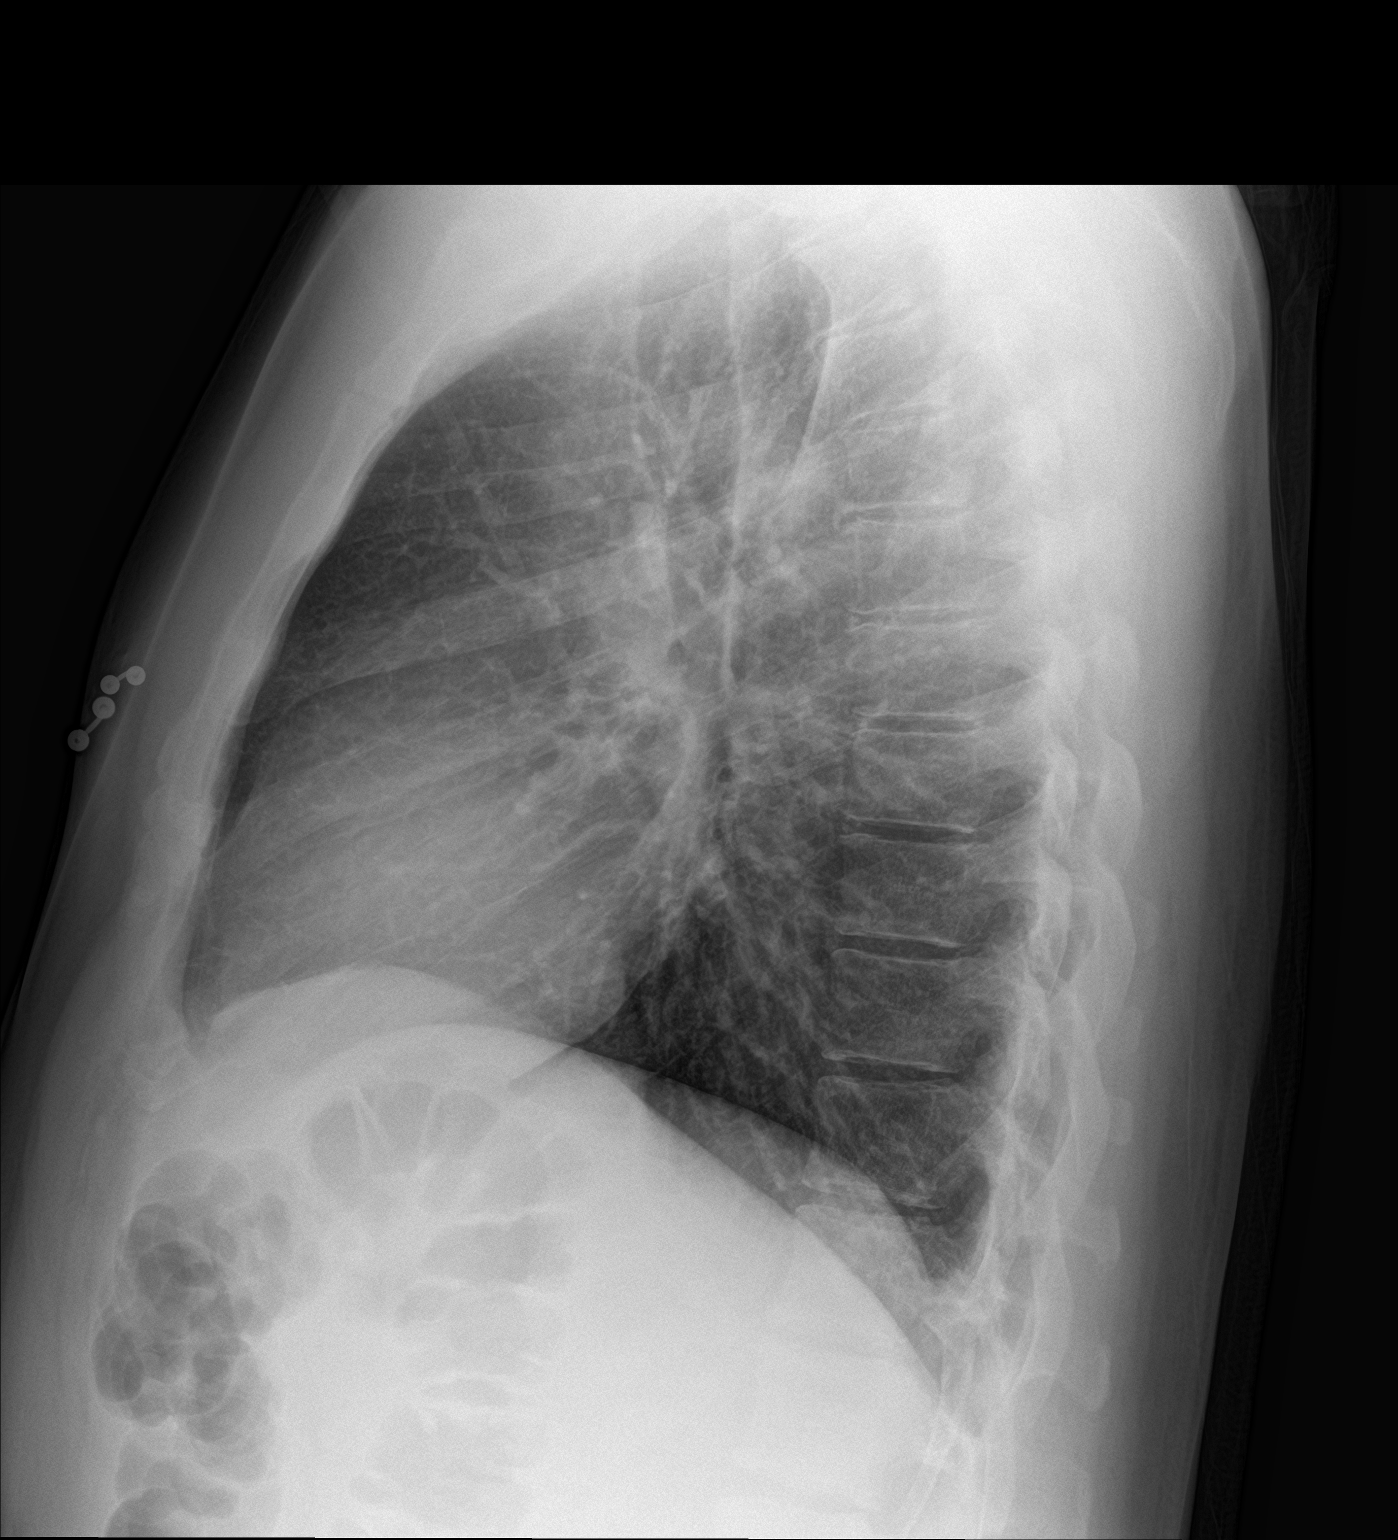

[2 of 2 positions shown; findings below may reference images not displayed]

FINDINGS: Normal heart size and mediastinal contours. No acute infiltrate or
edema. No effusion or pneumothorax. No acute osseous findings.
IMPRESSION: Negative chest.
